# Patient Record
Sex: Male | Born: 2000 | Race: Black or African American | Hispanic: No | Marital: Single | Smoking: Never smoker
Health system: Southern US, Community
[De-identification: ages and names within clinical notes are randomized; demographics above are authoritative.]

## PROBLEM LIST (undated history)

## (undated) DIAGNOSIS — A539 Syphilis, unspecified: Secondary | ICD-10-CM

## (undated) DIAGNOSIS — W3400XA Accidental discharge from unspecified firearms or gun, initial encounter: Secondary | ICD-10-CM

## (undated) DIAGNOSIS — Y249XXA Unspecified firearm discharge, undetermined intent, initial encounter: Secondary | ICD-10-CM

## (undated) HISTORY — PX: ABDOMINAL SURGERY: SHX537

## (undated) HISTORY — PX: TONSILLECTOMY: SUR1361

## (undated) HISTORY — DX: Syphilis, unspecified: A53.9

---
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
## 2018-07-11 ENCOUNTER — Other Ambulatory Visit: Payer: Self-pay

## 2018-07-11 ENCOUNTER — Encounter (HOSPITAL_BASED_OUTPATIENT_CLINIC_OR_DEPARTMENT_OTHER): Payer: Self-pay | Admitting: *Deleted

## 2018-07-11 ENCOUNTER — Emergency Department (HOSPITAL_BASED_OUTPATIENT_CLINIC_OR_DEPARTMENT_OTHER)
Admission: EM | Admit: 2018-07-11 | Discharge: 2018-07-11 | Disposition: A | Discharge status: Home / Self Care | Payer: Medicaid Other | Attending: Emergency Medicine | Admitting: Emergency Medicine

## 2018-07-11 DIAGNOSIS — T65893A Toxic effect of other specified substances, assault, initial encounter: Secondary | ICD-10-CM | POA: Diagnosis not present

## 2018-07-11 DIAGNOSIS — R21 Rash and other nonspecific skin eruption: Secondary | ICD-10-CM | POA: Diagnosis present

## 2018-07-11 HISTORY — DX: Accidental discharge from unspecified firearms or gun, initial encounter: W34.00XA

## 2018-07-11 HISTORY — DX: Unspecified firearm discharge, undetermined intent, initial encounter: Y24.9XXA
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
## 2018-07-11 NOTE — Discharge Instructions (Signed)
Take 4 over the counter ibuprofen tablets 3 times a day or 2 over-the-counter naproxen tablets twice a day for pain. Also take tylenol 1000mg(2 extra strength) four times a day.    

## 2018-08-02 ENCOUNTER — Emergency Department (HOSPITAL_BASED_OUTPATIENT_CLINIC_OR_DEPARTMENT_OTHER)
Admission: EM | Admit: 2018-08-02 | Discharge: 2018-08-03 | Disposition: A | Discharge status: Home / Self Care | Payer: Medicaid Other | Attending: Emergency Medicine | Admitting: Emergency Medicine

## 2018-08-02 ENCOUNTER — Encounter (HOSPITAL_BASED_OUTPATIENT_CLINIC_OR_DEPARTMENT_OTHER): Payer: Self-pay | Admitting: *Deleted

## 2018-08-02 ENCOUNTER — Other Ambulatory Visit: Payer: Self-pay

## 2018-08-02 ENCOUNTER — Emergency Department (HOSPITAL_BASED_OUTPATIENT_CLINIC_OR_DEPARTMENT_OTHER): Payer: Medicaid Other

## 2018-08-02 DIAGNOSIS — Y9389 Activity, other specified: Secondary | ICD-10-CM | POA: Insufficient documentation

## 2018-08-02 DIAGNOSIS — S39012A Strain of muscle, fascia and tendon of lower back, initial encounter: Secondary | ICD-10-CM | POA: Diagnosis not present

## 2018-08-02 DIAGNOSIS — Z79899 Other long term (current) drug therapy: Secondary | ICD-10-CM | POA: Insufficient documentation

## 2018-08-02 DIAGNOSIS — Y999 Unspecified external cause status: Secondary | ICD-10-CM | POA: Diagnosis not present

## 2018-08-02 DIAGNOSIS — S3992XA Unspecified injury of lower back, initial encounter: Secondary | ICD-10-CM | POA: Diagnosis present

## 2018-08-02 DIAGNOSIS — Y92410 Unspecified street and highway as the place of occurrence of the external cause: Secondary | ICD-10-CM | POA: Insufficient documentation

## 2018-08-02 MED ORDER — IBUPROFEN 800 MG PO TABS
800.0000 mg | ORAL_TABLET | Freq: Once | ORAL | Status: AC
Start: 2018-08-02 — End: 2018-08-02
  Administered 2018-08-02: 800 mg via ORAL
  Filled 2018-08-02: qty 1

## 2018-08-02 NOTE — ED Notes (Signed)
Pt states "I was told I could leave and dont have to wait for results". Pt informed he has to wait to be discharged and wait in room. Informed pt his mother could come back to room. Pt cursing but remains in room.
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
## 2018-08-02 NOTE — ED Notes (Signed)
Pt no longer has arm band- asked pt where it was and he said he took it off and doesn't know.

## 2018-08-02 NOTE — ED Notes (Signed)
Patient transported to X-ray 

## 2018-08-02 NOTE — ED Notes (Signed)
Pt requesting updates, provided. Refused to put on gown as requested by this RN. Ambulatory out to the waiting room to socialize
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
## 2018-08-03 MED ORDER — IBUPROFEN 600 MG PO TABS: 600.0000 mg | ORAL_TABLET | Freq: Four times a day (QID) | ORAL | 0 refills | Status: DC | PRN

## 2018-08-03 NOTE — Discharge Instructions (Signed)
Your x-ray is negative.  Take the anti-inflammatories as needed for soreness over the next several days.  Follow-up with your primary doctor.  Return to the ED if you develop new or worsening symptoms including weakness, numbness, tingling or any other concerns.

## 2018-10-08 ENCOUNTER — Emergency Department (HOSPITAL_BASED_OUTPATIENT_CLINIC_OR_DEPARTMENT_OTHER)
Admission: EM | Admit: 2018-10-08 | Discharge: 2018-10-08 | Disposition: A | Discharge status: Home / Self Care | Payer: Medicaid Other | Attending: Emergency Medicine | Admitting: Emergency Medicine

## 2018-10-08 ENCOUNTER — Emergency Department (HOSPITAL_BASED_OUTPATIENT_CLINIC_OR_DEPARTMENT_OTHER): Payer: Medicaid Other

## 2018-10-08 ENCOUNTER — Encounter (HOSPITAL_BASED_OUTPATIENT_CLINIC_OR_DEPARTMENT_OTHER): Payer: Self-pay | Admitting: *Deleted

## 2018-10-08 ENCOUNTER — Other Ambulatory Visit: Payer: Self-pay

## 2018-10-08 DIAGNOSIS — R0789 Other chest pain: Secondary | ICD-10-CM | POA: Insufficient documentation

## 2018-10-08 DIAGNOSIS — Z20828 Contact with and (suspected) exposure to other viral communicable diseases: Secondary | ICD-10-CM | POA: Diagnosis not present

## 2018-10-08 DIAGNOSIS — R0602 Shortness of breath: Secondary | ICD-10-CM | POA: Insufficient documentation

## 2018-10-08 DIAGNOSIS — Z20822 Contact with and (suspected) exposure to covid-19: Secondary | ICD-10-CM

## 2018-10-08 DIAGNOSIS — R509 Fever, unspecified: Secondary | ICD-10-CM | POA: Diagnosis present

## 2018-10-08 DIAGNOSIS — B349 Viral infection, unspecified: Secondary | ICD-10-CM

## 2018-10-08 LAB — CBC WITH DIFFERENTIAL/PLATELET
Abs Immature Granulocytes: 0.04 10*3/uL (ref 0.00–0.07)
Basophils Absolute: 0 10*3/uL (ref 0.0–0.1)
Basophils Relative: 0 %
Eosinophils Absolute: 0 10*3/uL (ref 0.0–0.5)
Eosinophils Relative: 0 %
HCT: 48.1 % (ref 39.0–52.0)
Hemoglobin: 14.6 g/dL (ref 13.0–17.0)
Immature Granulocytes: 0 %
Lymphocytes Relative: 11 %
Lymphs Abs: 1.7 10*3/uL (ref 0.7–4.0)
MCH: 24.9 pg — ABNORMAL LOW (ref 26.0–34.0)
MCHC: 30.4 g/dL (ref 30.0–36.0)
MCV: 82.1 fL (ref 80.0–100.0)
Monocytes Absolute: 1.1 10*3/uL — ABNORMAL HIGH (ref 0.1–1.0)
Monocytes Relative: 7 %
Neutro Abs: 12 10*3/uL — ABNORMAL HIGH (ref 1.7–7.7)
Neutrophils Relative %: 82 %
Platelets: 279 10*3/uL (ref 150–400)
RBC: 5.86 MIL/uL — ABNORMAL HIGH (ref 4.22–5.81)
RDW: 14.7 % (ref 11.5–15.5)
WBC: 14.7 10*3/uL — ABNORMAL HIGH (ref 4.0–10.5)
nRBC: 0 % (ref 0.0–0.2)

## 2018-10-08 LAB — COMPREHENSIVE METABOLIC PANEL
ALT: 26 U/L (ref 0–44)
AST: 16 U/L (ref 15–41)
Albumin: 4.2 g/dL (ref 3.5–5.0)
Alkaline Phosphatase: 91 U/L (ref 38–126)
Anion gap: 9 (ref 5–15)
BUN: 7 mg/dL (ref 6–20)
CO2: 24 mmol/L (ref 22–32)
Calcium: 9.1 mg/dL (ref 8.9–10.3)
Chloride: 102 mmol/L (ref 98–111)
Creatinine, Ser: 0.74 mg/dL (ref 0.61–1.24)
GFR calc Af Amer: >60 mL/min (ref >60)
GFR calc non Af Amer: >60 mL/min (ref >60)
Glucose, Bld: 103 mg/dL — ABNORMAL HIGH (ref 70–99)
Potassium: 3.2 mmol/L — ABNORMAL LOW (ref 3.5–5.1)
Sodium: 135 mmol/L (ref 135–145)
Total Bilirubin: 1.3 mg/dL — ABNORMAL HIGH (ref 0.3–1.2)
Total Protein: 8.4 g/dL — ABNORMAL HIGH (ref 6.5–8.1)

## 2018-10-08 LAB — TROPONIN I: Troponin I: <0.03 ng/mL (ref <0.03)

## 2018-10-08 LAB — BRAIN NATRIURETIC PEPTIDE: B Natriuretic Peptide: 14.8 pg/mL (ref 0.0–100.0)

## 2018-10-08 LAB — SARS CORONAVIRUS 2 AG (30 MIN TAT): SARS Coronavirus 2 Ag: NEGATIVE

## 2018-10-08 LAB — D-DIMER, QUANTITATIVE: D-Dimer, Quant: 0.3 ug/mL-FEU (ref 0.00–0.50)

## 2018-10-08 LAB — LACTIC ACID, PLASMA: Lactic Acid, Venous: 1 mmol/L (ref 0.5–1.9)

## 2018-10-08 MED ORDER — ACETAMINOPHEN 325 MG PO TABS
650.0000 mg | ORAL_TABLET | Freq: Once | ORAL | Status: AC
Start: 2018-10-08 — End: 2018-10-08
  Administered 2018-10-08: 650 mg via ORAL

## 2018-10-08 MED ORDER — ACETAMINOPHEN 500 MG PO TABS: 1000.0000 mg | ORAL_TABLET | Freq: Four times a day (QID) | ORAL | 0 refills | Status: AC | PRN

## 2018-10-08 MED ORDER — ACETAMINOPHEN 325 MG PO TABS
ORAL_TABLET | ORAL | Status: AC
  Filled 2018-10-08: qty 2

## 2018-10-08 NOTE — ED Notes (Addendum)
Pt c/o generalized body aches, headache and fever that started today. C/o right knee pain for a week and central chest pain that started an hour ago.

## 2018-10-08 NOTE — ED Triage Notes (Addendum)
Pt c/o generalized pain  mid sternal cp x 1 day also c/o h/a and fever

## 2018-10-08 NOTE — Discharge Instructions (Signed)
1.  Due to your sudden onset of fever with headache and chest symptoms and body aches, you MAY have COVID-19.  Your test in the emergency department was negative, HOWEVER, the swab tests are not always right. 2.  You must self isolate and follow the instructions in your discharge instructions for coronavirus. 3.  Take Tylenol to control fevers and body aches. Motrin (ibuprofen) is not recommended 4.  Call your doctor tomorrow morning to set up a telemetry visit within the next 2 to 3 days. 5.  Return to the emergency department if you are getting worsening shortness of breath, confusion, dehydration or other concerning symptoms.CALL AHEAD TO LET us KNOW YOU MAY HAVE CORONA VIRUS SO PROPER PRECAUTIONS CAN  BE TAKEN.
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
## 2018-10-14 LAB — CULTURE, BLOOD (ROUTINE X 2): Culture: NO GROWTH

## 2019-03-10 ENCOUNTER — Emergency Department (HOSPITAL_BASED_OUTPATIENT_CLINIC_OR_DEPARTMENT_OTHER)
Admission: EM | Admit: 2019-03-10 | Discharge: 2019-03-10 | Disposition: A | Discharge status: Home / Self Care | Payer: Medicaid Other | Attending: Emergency Medicine | Admitting: Emergency Medicine

## 2019-03-10 ENCOUNTER — Other Ambulatory Visit: Payer: Self-pay

## 2019-03-10 ENCOUNTER — Encounter (HOSPITAL_BASED_OUTPATIENT_CLINIC_OR_DEPARTMENT_OTHER): Payer: Self-pay | Admitting: Emergency Medicine

## 2019-03-10 DIAGNOSIS — R4182 Altered mental status, unspecified: Secondary | ICD-10-CM | POA: Insufficient documentation

## 2019-03-10 DIAGNOSIS — R404 Transient alteration of awareness: Secondary | ICD-10-CM

## 2019-03-10 DIAGNOSIS — R55 Syncope and collapse: Secondary | ICD-10-CM | POA: Diagnosis present

## 2019-03-10 LAB — CBC WITH DIFFERENTIAL/PLATELET
Abs Immature Granulocytes: 0.05 10*3/uL (ref 0.00–0.07)
Basophils Absolute: 0 10*3/uL (ref 0.0–0.1)
Basophils Relative: 0 %
Eosinophils Absolute: 0.1 10*3/uL (ref 0.0–0.5)
Eosinophils Relative: 0 %
HCT: 47.2 % (ref 39.0–52.0)
Hemoglobin: 14.4 g/dL (ref 13.0–17.0)
Immature Granulocytes: 0 %
Lymphocytes Relative: 30 %
Lymphs Abs: 3.6 10*3/uL (ref 0.7–4.0)
MCH: 25 pg — ABNORMAL LOW (ref 26.0–34.0)
MCHC: 30.5 g/dL (ref 30.0–36.0)
MCV: 82.1 fL (ref 80.0–100.0)
Monocytes Absolute: 0.7 10*3/uL (ref 0.1–1.0)
Monocytes Relative: 5 %
Neutro Abs: 7.9 10*3/uL — ABNORMAL HIGH (ref 1.7–7.7)
Neutrophils Relative %: 65 %
Platelets: 313 10*3/uL (ref 150–400)
RBC: 5.75 MIL/uL (ref 4.22–5.81)
RDW: 14.1 % (ref 11.5–15.5)
WBC: 12.3 10*3/uL — ABNORMAL HIGH (ref 4.0–10.5)
nRBC: 0 % (ref 0.0–0.2)

## 2019-03-10 LAB — BASIC METABOLIC PANEL
Anion gap: 15 (ref 5–15)
BUN: 16 mg/dL (ref 6–20)
CO2: 22 mmol/L (ref 22–32)
Calcium: 9.1 mg/dL (ref 8.9–10.3)
Chloride: 100 mmol/L (ref 98–111)
Creatinine, Ser: 1.04 mg/dL (ref 0.61–1.24)
GFR calc Af Amer: >60 mL/min (ref >60)
GFR calc non Af Amer: >60 mL/min (ref >60)
Glucose, Bld: 154 mg/dL — ABNORMAL HIGH (ref 70–99)
Potassium: 3 mmol/L — ABNORMAL LOW (ref 3.5–5.1)
Sodium: 137 mmol/L (ref 135–145)

## 2019-03-10 MED ORDER — POTASSIUM CHLORIDE CRYS ER 20 MEQ PO TBCR
40.0000 meq | EXTENDED_RELEASE_TABLET | Freq: Once | ORAL | Status: AC
  Administered 2019-03-10: 40 meq via ORAL
  Filled 2019-03-10: qty 2

## 2019-03-10 NOTE — Discharge Instructions (Addendum)
   Do not drive until seen by your physician for your condition  Do not climb ladders/roofs/trees as a seizure can occur at that height and cause serious harm  Do not bathe/swim alone as a seizure can occur and cause serious harm  Please followup with your physician or neurologist for further testing and possible treatment  
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
## 2019-03-10 NOTE — ED Triage Notes (Signed)
Patient arrived via POV c/o near syncopal episode while sitting on couch. Patient states he became diaphoretic, "his feet were hot and he had trouble breathing and speaking but could move". Patient is AO x 4, VS WDL, staggering gait.

## 2019-12-20 ENCOUNTER — Encounter (HOSPITAL_BASED_OUTPATIENT_CLINIC_OR_DEPARTMENT_OTHER): Payer: Self-pay | Admitting: Emergency Medicine

## 2019-12-20 ENCOUNTER — Other Ambulatory Visit: Payer: Self-pay

## 2019-12-20 ENCOUNTER — Emergency Department (HOSPITAL_BASED_OUTPATIENT_CLINIC_OR_DEPARTMENT_OTHER)
Admission: EM | Admit: 2019-12-20 | Discharge: 2019-12-20 | Disposition: A | Discharge status: Home / Self Care | Payer: Medicaid Other | Attending: Emergency Medicine | Admitting: Emergency Medicine

## 2019-12-20 DIAGNOSIS — L739 Follicular disorder, unspecified: Secondary | ICD-10-CM | POA: Insufficient documentation

## 2019-12-20 DIAGNOSIS — L731 Pseudofolliculitis barbae: Secondary | ICD-10-CM

## 2019-12-20 DIAGNOSIS — R222 Localized swelling, mass and lump, trunk: Secondary | ICD-10-CM | POA: Diagnosis present

## 2019-12-20 NOTE — Discharge Instructions (Addendum)
You have been seen here for an ingrown hair.  Physical exam and vital signs are reassuring.  I recommend that you continue to place warm compresses on the area as this will help bring bacteria to the surface and the cyst will drain spontaneously.  You can take over-the-counter pain medications like ibuprofen or Tylenol as needed please follow dosing on the back of bottle.  I have given you information for [facility] they work with individuals with little to no insurance I recommend tat call him as I feel you will need a primary care doctor .    I want to come back to to the emergency department if you develop fever, chills, shortness of breath, chest pain, nausea, vomiting, diarrhea as the symptoms require further evaluation management.

## 2019-12-20 NOTE — ED Triage Notes (Signed)
Pt states that he has a bump to his upper penile region x 2 days
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
## 2020-03-13 ENCOUNTER — Encounter (HOSPITAL_BASED_OUTPATIENT_CLINIC_OR_DEPARTMENT_OTHER): Payer: Self-pay | Admitting: *Deleted

## 2020-03-13 ENCOUNTER — Other Ambulatory Visit: Payer: Self-pay

## 2020-03-13 ENCOUNTER — Emergency Department (HOSPITAL_BASED_OUTPATIENT_CLINIC_OR_DEPARTMENT_OTHER)
Admission: EM | Admit: 2020-03-13 | Discharge: 2020-03-13 | Disposition: A | Discharge status: Home / Self Care | Payer: Medicaid Other | Attending: Emergency Medicine | Admitting: Emergency Medicine

## 2020-03-13 DIAGNOSIS — L03211 Cellulitis of face: Secondary | ICD-10-CM | POA: Insufficient documentation

## 2020-03-13 DIAGNOSIS — H00014 Hordeolum externum left upper eyelid: Secondary | ICD-10-CM | POA: Diagnosis not present

## 2020-03-13 DIAGNOSIS — H02846 Edema of left eye, unspecified eyelid: Secondary | ICD-10-CM | POA: Diagnosis present

## 2020-03-13 MED ORDER — CEPHALEXIN 500 MG PO CAPS: 500.0000 mg | ORAL_CAPSULE | Freq: Two times a day (BID) | ORAL | 0 refills | Status: AC

## 2020-03-13 NOTE — ED Triage Notes (Signed)
Pt has sty left upper eyelid x 2 days
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
## 2020-03-13 NOTE — Discharge Instructions (Signed)
Take the antibiotics as directed. Continue warm compresses. Return to the ER for worsening pain, swelling, redness or drainage or trouble moving your eye.

## 2020-08-03 ENCOUNTER — Emergency Department (HOSPITAL_BASED_OUTPATIENT_CLINIC_OR_DEPARTMENT_OTHER): Payer: Medicaid Other

## 2020-08-03 ENCOUNTER — Emergency Department (HOSPITAL_BASED_OUTPATIENT_CLINIC_OR_DEPARTMENT_OTHER)
Admission: EM | Admit: 2020-08-03 | Discharge: 2020-08-03 | Disposition: A | Discharge status: Home / Self Care | Payer: Medicaid Other | Attending: Emergency Medicine | Admitting: Emergency Medicine

## 2020-08-03 ENCOUNTER — Encounter (HOSPITAL_BASED_OUTPATIENT_CLINIC_OR_DEPARTMENT_OTHER): Payer: Self-pay

## 2020-08-03 ENCOUNTER — Other Ambulatory Visit: Payer: Self-pay

## 2020-08-03 DIAGNOSIS — M25511 Pain in right shoulder: Secondary | ICD-10-CM | POA: Diagnosis not present

## 2020-08-03 DIAGNOSIS — M25551 Pain in right hip: Secondary | ICD-10-CM | POA: Insufficient documentation

## 2020-08-03 DIAGNOSIS — M542 Cervicalgia: Secondary | ICD-10-CM | POA: Insufficient documentation

## 2020-08-03 DIAGNOSIS — Y9389 Activity, other specified: Secondary | ICD-10-CM | POA: Insufficient documentation

## 2020-08-03 DIAGNOSIS — S0990XA Unspecified injury of head, initial encounter: Secondary | ICD-10-CM | POA: Diagnosis not present

## 2020-08-03 DIAGNOSIS — W208XXA Other cause of strike by thrown, projected or falling object, initial encounter: Secondary | ICD-10-CM | POA: Insufficient documentation

## 2020-08-03 DIAGNOSIS — T1490XA Injury, unspecified, initial encounter: Secondary | ICD-10-CM

## 2020-08-03 MED ORDER — IBUPROFEN 800 MG PO TABS
800.0000 mg | ORAL_TABLET | Freq: Once | ORAL | Status: AC
  Administered 2020-08-03: 800 mg via ORAL
  Filled 2020-08-03: qty 1

## 2020-08-03 MED ORDER — OXYCODONE-ACETAMINOPHEN 5-325 MG PO TABS
1.0000 | ORAL_TABLET | Freq: Once | ORAL | Status: DC
  Filled 2020-08-03: qty 1

## 2020-08-03 MED ORDER — IBUPROFEN 800 MG PO TABS: 800.0000 mg | ORAL_TABLET | Freq: Once | ORAL | Status: DC

## 2020-08-03 NOTE — ED Triage Notes (Signed)
Pt c/o pain to right side of neck and head- "to the whole right side of my body" after his "ceiling fell in on me" ~15 min PTA-NAD-steady gait
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
## 2021-12-11 IMAGING — CT CT HEAD W/O CM
4 series · 15 of 47 positions shown, 17 images · non-contrast
Comparison: None.

CLINICAL DATA: Minor head trauma.

EXAM:
CT HEAD WITHOUT CONTRAST
TECHNIQUE: Contiguous axial images were obtained from the base of the skull
through the vertex without intravenous contrast.

[Series 2: head wo · axial · 0.44mm/px · z∈[-188,-68]mm · 7 of 33 slices shown, 9 images]
[im 5/33  brain]
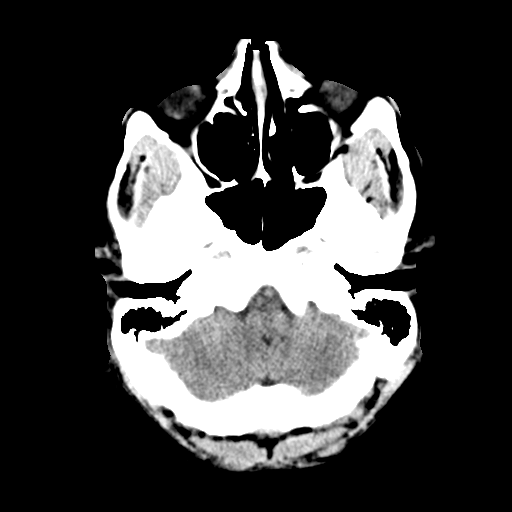
[im 5/33  bone]
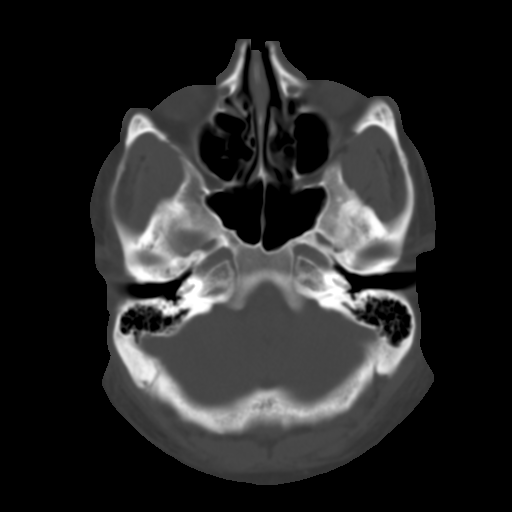
[im 9/33  brain]
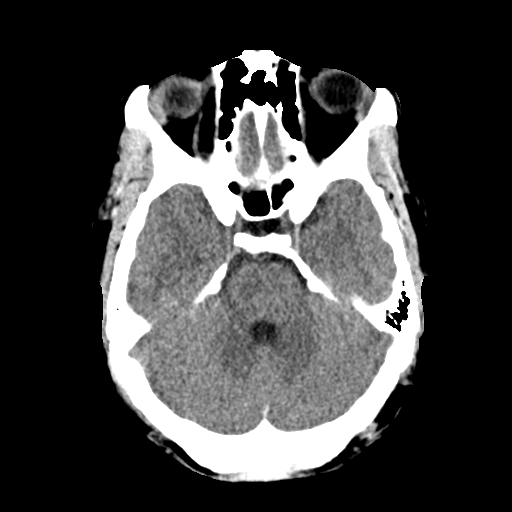
[im 13/33  brain]
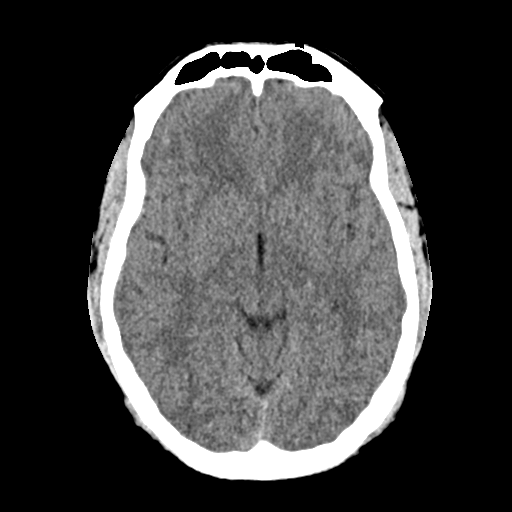
[im 17/33  brain]
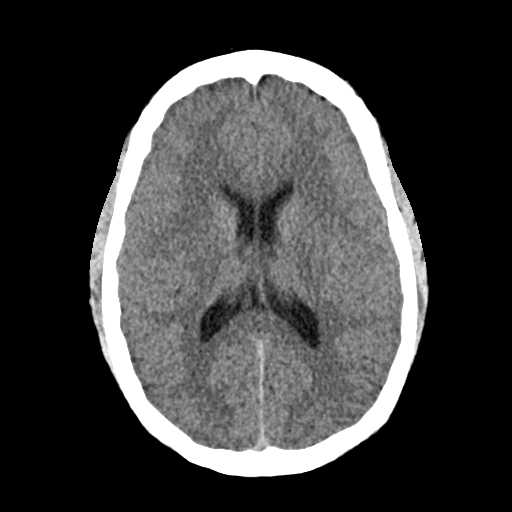
[im 21/33  brain]
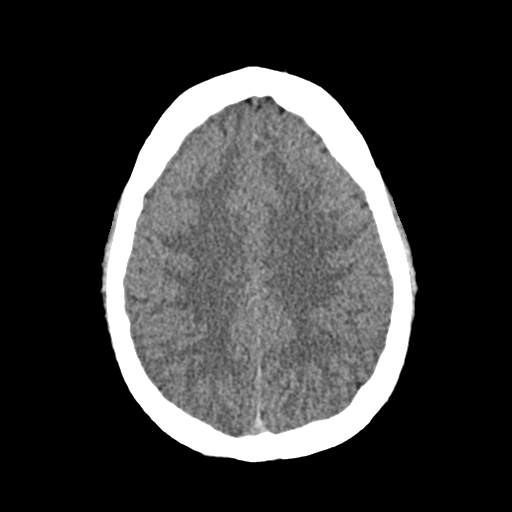
[im 21/33  bone]
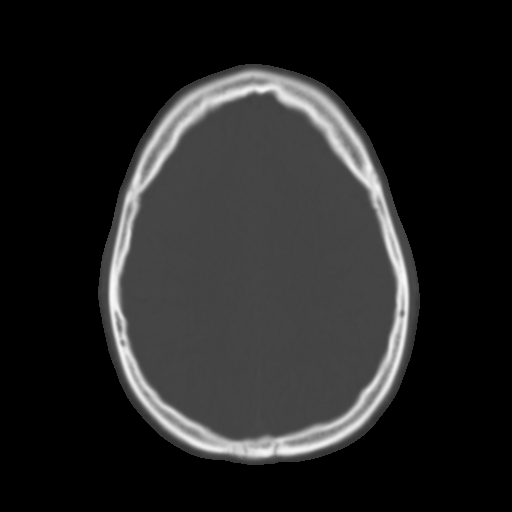
[im 25/33  brain]
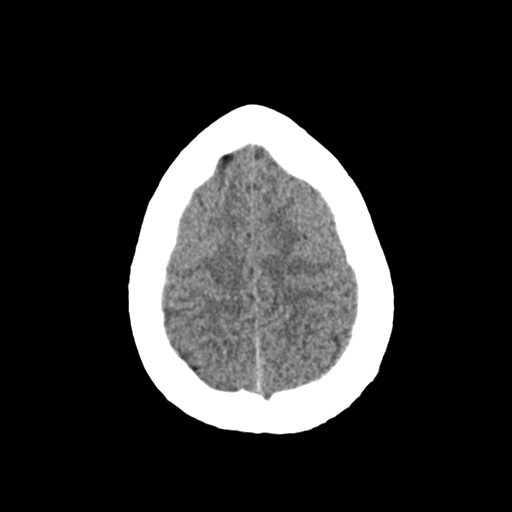
[im 29/33  brain]
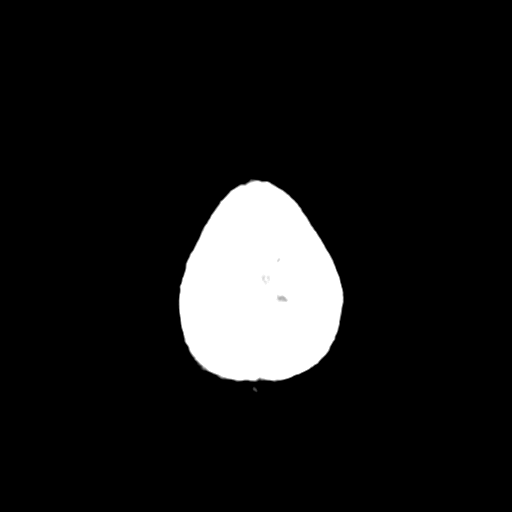

[Series 3: head bone · axial · 0.44mm/px · z∈[-192,-176]mm · 2 of 83 slices shown]
[im 9/83  bone]
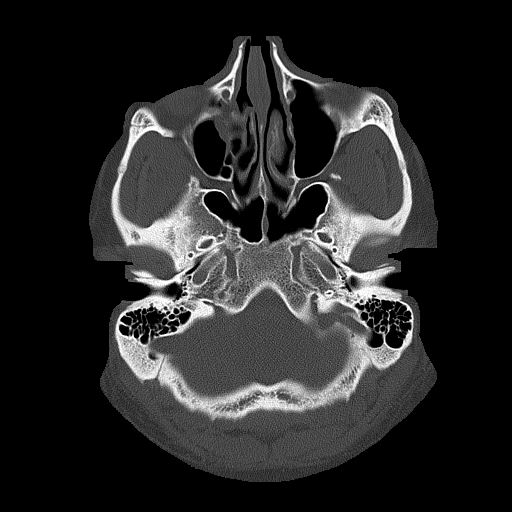
[im 17/83  bone]
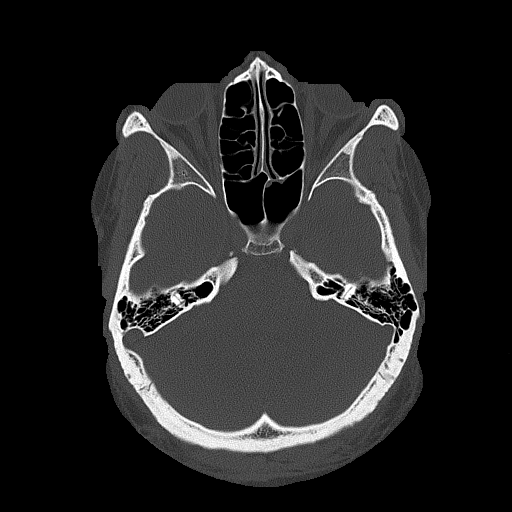

[Series 4: coronal soft · coronal · 0.33mm/px · 3 of 74 slices shown]
[im 25/74  brain]
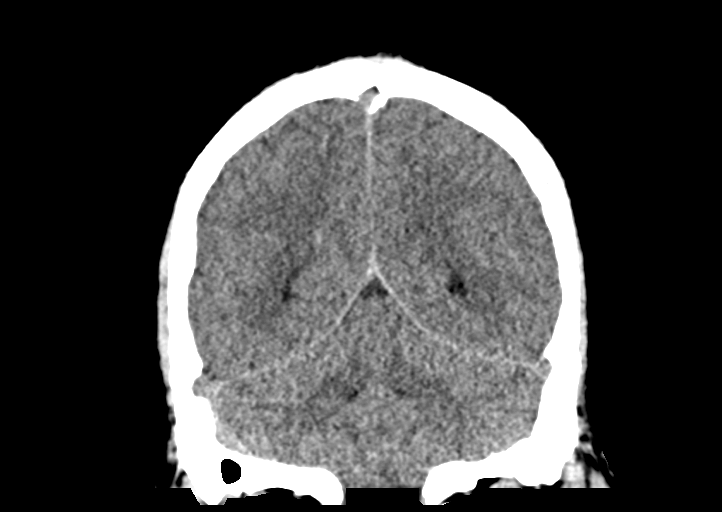
[im 33/74  brain]
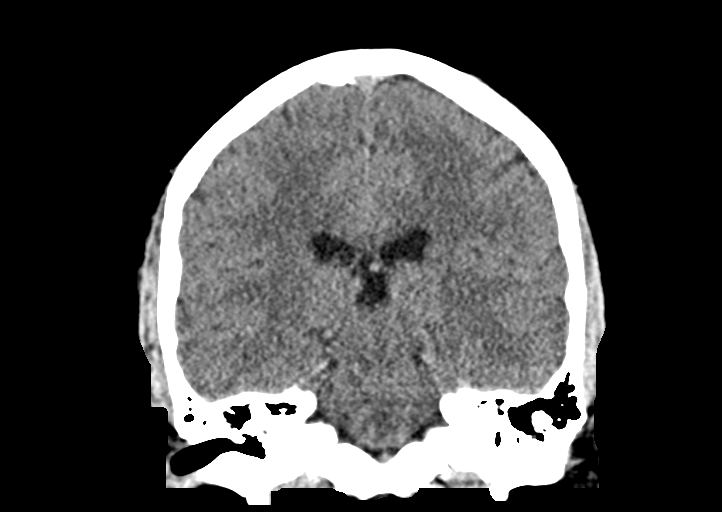
[im 41/74  brain]
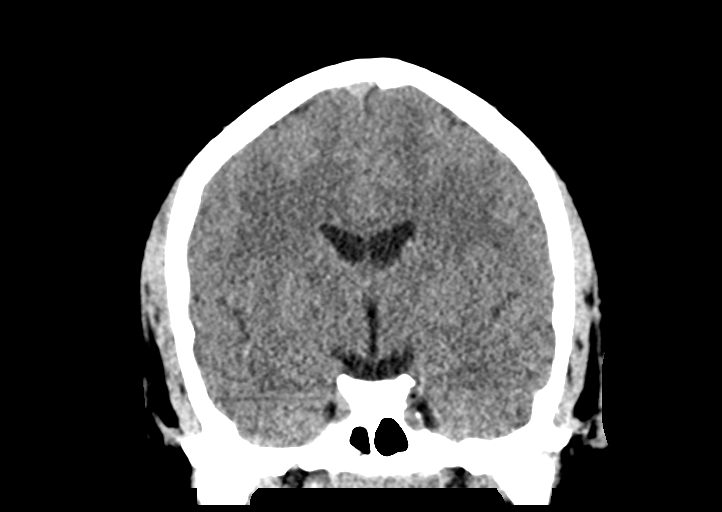

[Series 5: sag soft · sagittal · 0.32mm/px · 3 of 67 slices shown]
[im 23/67  brain]
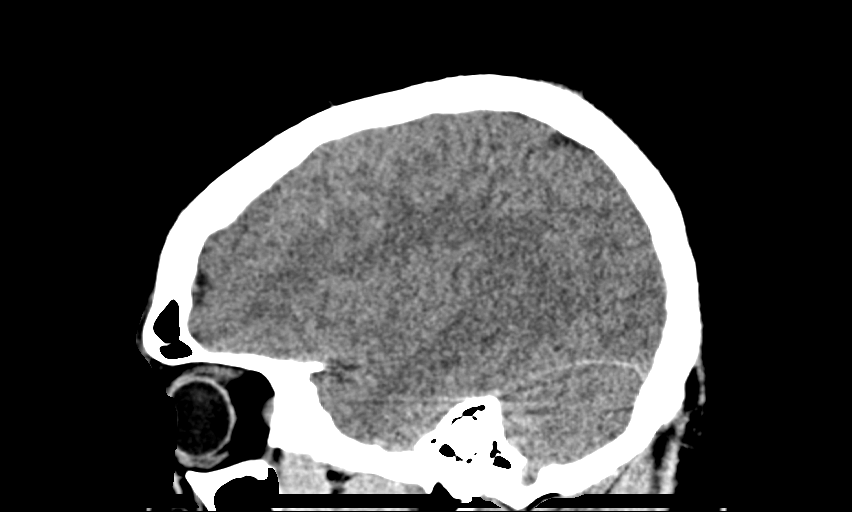
[im 34/67  brain]
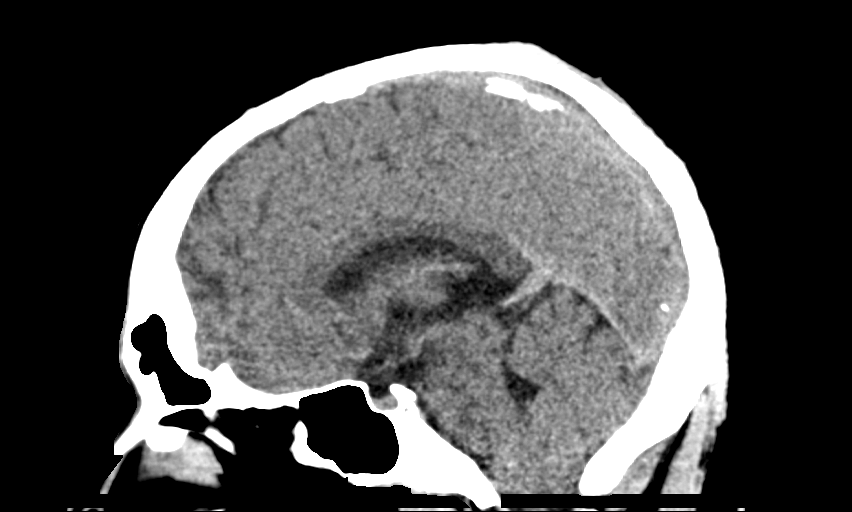
[im 45/67  brain]
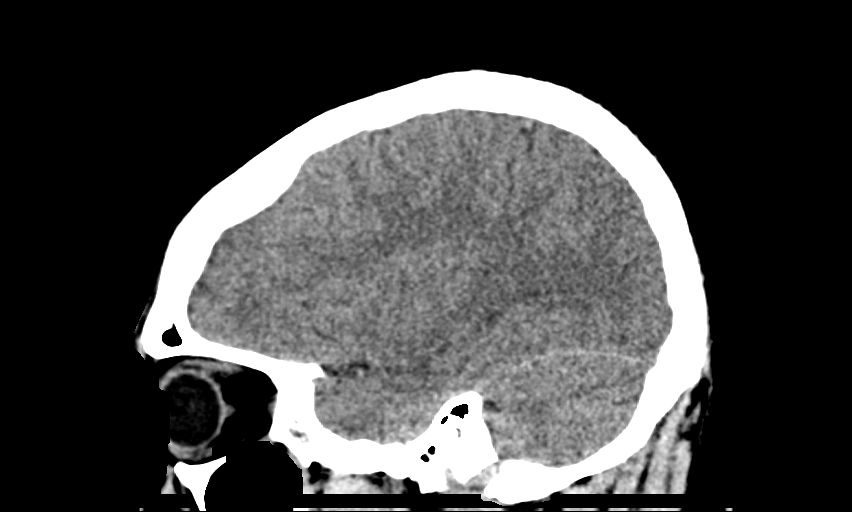

[15 of 47 positions shown; findings below may reference images not displayed]

FINDINGS: Brain: No evidence of acute infarction, hemorrhage, hydrocephalus,
extra-axial collection or mass lesion/mass effect.

Vascular: No hyperdense vessel or unexpected calcification.

Skull: Normal. Negative for fracture or focal lesion.

Sinuses/Orbits: No acute finding.

Other: None.
IMPRESSION: No acute intracranial abnormality noted.

## 2022-12-23 ENCOUNTER — Other Ambulatory Visit: Payer: Self-pay

## 2022-12-23 ENCOUNTER — Encounter (HOSPITAL_BASED_OUTPATIENT_CLINIC_OR_DEPARTMENT_OTHER): Payer: Self-pay

## 2022-12-23 ENCOUNTER — Emergency Department (HOSPITAL_BASED_OUTPATIENT_CLINIC_OR_DEPARTMENT_OTHER): Payer: Medicaid Other

## 2022-12-23 ENCOUNTER — Emergency Department (HOSPITAL_BASED_OUTPATIENT_CLINIC_OR_DEPARTMENT_OTHER)
Admission: EM | Admit: 2022-12-23 | Discharge: 2022-12-23 | Disposition: A | Discharge status: Home / Self Care | Payer: Medicaid Other | Attending: Emergency Medicine | Admitting: Emergency Medicine

## 2022-12-23 DIAGNOSIS — S50812A Abrasion of left forearm, initial encounter: Secondary | ICD-10-CM | POA: Diagnosis not present

## 2022-12-23 DIAGNOSIS — E669 Obesity, unspecified: Secondary | ICD-10-CM | POA: Diagnosis not present

## 2022-12-23 DIAGNOSIS — R0789 Other chest pain: Secondary | ICD-10-CM | POA: Diagnosis not present

## 2022-12-23 DIAGNOSIS — S62667A Nondisplaced fracture of distal phalanx of left little finger, initial encounter for closed fracture: Secondary | ICD-10-CM | POA: Insufficient documentation

## 2022-12-23 DIAGNOSIS — Z6841 Body Mass Index (BMI) 40.0 and over, adult: Secondary | ICD-10-CM | POA: Insufficient documentation

## 2022-12-23 DIAGNOSIS — S022XXA Fracture of nasal bones, initial encounter for closed fracture: Secondary | ICD-10-CM | POA: Insufficient documentation

## 2022-12-23 DIAGNOSIS — Y900 Blood alcohol level of less than 20 mg/100 ml: Secondary | ICD-10-CM | POA: Insufficient documentation

## 2022-12-23 DIAGNOSIS — Y92410 Unspecified street and highway as the place of occurrence of the external cause: Secondary | ICD-10-CM | POA: Insufficient documentation

## 2022-12-23 DIAGNOSIS — Z23 Encounter for immunization: Secondary | ICD-10-CM | POA: Insufficient documentation

## 2022-12-23 DIAGNOSIS — S0990XA Unspecified injury of head, initial encounter: Secondary | ICD-10-CM | POA: Diagnosis present

## 2022-12-23 DIAGNOSIS — T07XXXA Unspecified multiple injuries, initial encounter: Secondary | ICD-10-CM

## 2022-12-23 LAB — I-STAT CHEM 8, ED
BUN: 10 mg/dL (ref 6–20)
Calcium, Ion: 1.2 mmol/L (ref 1.15–1.40)
Chloride: 104 mmol/L (ref 98–111)
Creatinine, Ser: 1 mg/dL (ref 0.61–1.24)
Glucose, Bld: 102 mg/dL — ABNORMAL HIGH (ref 70–99)
HCT: 48 % (ref 39.0–52.0)
Hemoglobin: 16.3 g/dL (ref 13.0–17.0)
Potassium: 3.7 mmol/L (ref 3.5–5.1)
Sodium: 141 mmol/L (ref 135–145)
TCO2: 27 mmol/L (ref 22–32)

## 2022-12-23 LAB — CBC
HCT: 45.6 % (ref 39.0–52.0)
Hemoglobin: 14.4 g/dL (ref 13.0–17.0)
MCH: 25.5 pg — ABNORMAL LOW (ref 26.0–34.0)
MCHC: 31.6 g/dL (ref 30.0–36.0)
MCV: 80.9 fL (ref 80.0–100.0)
Platelets: 328 10*3/uL (ref 150–400)
RBC: 5.64 MIL/uL (ref 4.22–5.81)
RDW: 14.8 % (ref 11.5–15.5)
WBC: 15.2 10*3/uL — ABNORMAL HIGH (ref 4.0–10.5)
nRBC: 0 % (ref 0.0–0.2)

## 2022-12-23 LAB — COMPREHENSIVE METABOLIC PANEL
ALT: 57 U/L — ABNORMAL HIGH (ref 0–44)
AST: 44 U/L — ABNORMAL HIGH (ref 15–41)
Albumin: 4.6 g/dL (ref 3.5–5.0)
Alkaline Phosphatase: 85 U/L (ref 38–126)
Anion gap: 10 (ref 5–15)
BUN: 11 mg/dL (ref 6–20)
CO2: 27 mmol/L (ref 22–32)
Calcium: 9.9 mg/dL (ref 8.9–10.3)
Chloride: 103 mmol/L (ref 98–111)
Creatinine, Ser: 0.86 mg/dL (ref 0.61–1.24)
GFR, Estimated: >60 mL/min (ref >60)
Glucose, Bld: 100 mg/dL — ABNORMAL HIGH (ref 70–99)
Potassium: 3.5 mmol/L (ref 3.5–5.1)
Sodium: 140 mmol/L (ref 135–145)
Total Bilirubin: 0.5 mg/dL (ref 0.3–1.2)
Total Protein: 8.6 g/dL — ABNORMAL HIGH (ref 6.5–8.1)

## 2022-12-23 LAB — LACTIC ACID, PLASMA: Lactic Acid, Venous: 1.5 mmol/L (ref 0.5–1.9)

## 2022-12-23 LAB — PROTIME-INR
INR: 1 (ref 0.8–1.2)
Prothrombin Time: 13.1 seconds (ref 11.4–15.2)

## 2022-12-23 LAB — ETHANOL: Alcohol, Ethyl (B): 15 mg/dL — ABNORMAL HIGH (ref <10)

## 2022-12-23 MED ORDER — CEPHALEXIN 500 MG PO CAPS: 500.0000 mg | ORAL_CAPSULE | Freq: Two times a day (BID) | ORAL | 0 refills | Status: AC

## 2022-12-23 MED ORDER — TETANUS-DIPHTH-ACELL PERTUSSIS 5-2.5-18.5 LF-MCG/0.5 IM SUSY
0.5000 mL | PREFILLED_SYRINGE | Freq: Once | INTRAMUSCULAR | Status: AC
  Administered 2022-12-23: 0.5 mL via INTRAMUSCULAR
  Filled 2022-12-23: qty 0.5

## 2022-12-23 MED ORDER — HYDROCODONE-ACETAMINOPHEN 5-325 MG PO TABS
1.0000 | ORAL_TABLET | Freq: Once | ORAL | Status: AC
  Administered 2022-12-23: 1 via ORAL
  Filled 2022-12-23: qty 1

## 2022-12-23 MED ORDER — IBUPROFEN 600 MG PO TABS: 600.0000 mg | ORAL_TABLET | Freq: Three times a day (TID) | ORAL | 0 refills | Status: AC | PRN

## 2022-12-23 MED ORDER — HYDROCODONE-ACETAMINOPHEN 5-325 MG PO TABS: 1.0000 | ORAL_TABLET | Freq: Four times a day (QID) | ORAL | 0 refills | Status: AC | PRN

## 2022-12-23 MED ORDER — FENTANYL CITRATE PF 50 MCG/ML IJ SOSY
50.0000 ug | PREFILLED_SYRINGE | Freq: Once | INTRAMUSCULAR | Status: AC
  Administered 2022-12-23: 50 ug via INTRAVENOUS
  Filled 2022-12-23: qty 1

## 2022-12-23 MED ORDER — IBUPROFEN 400 MG PO TABS
600.0000 mg | ORAL_TABLET | Freq: Once | ORAL | Status: AC
  Administered 2022-12-23: 600 mg via ORAL
  Filled 2022-12-23: qty 1

## 2022-12-23 MED ORDER — IOHEXOL 300 MG/ML  SOLN
125.0000 mL | Freq: Once | INTRAMUSCULAR | Status: AC | PRN
  Administered 2022-12-23: 125 mL via INTRAVENOUS

## 2022-12-23 NOTE — ED Notes (Signed)
Dressing applied to pt left hand fourth and fifth digit. Cleaned wound with sterile water. Non-adherent dressing applied along with triple antibiotic ointment.   Splint applied to left pinky finger.   Crutches also requested by patient.
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
## 2022-12-23 NOTE — ED Notes (Signed)
C-Collar applied to pt when he was placed in room
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
## 2022-12-23 NOTE — ED Notes (Signed)
Pt requested to call sister. Spoke with her and she is on the way back to the ER to be with pt.

## 2022-12-23 NOTE — ED Notes (Signed)
C collar placed at this time.

## 2023-12-03 ENCOUNTER — Encounter (HOSPITAL_BASED_OUTPATIENT_CLINIC_OR_DEPARTMENT_OTHER): Payer: Self-pay

## 2023-12-03 ENCOUNTER — Other Ambulatory Visit: Payer: Self-pay

## 2023-12-03 ENCOUNTER — Emergency Department (HOSPITAL_BASED_OUTPATIENT_CLINIC_OR_DEPARTMENT_OTHER)
Admission: EM | Admit: 2023-12-03 | Discharge: 2023-12-03 | Disposition: A | Discharge status: Home / Self Care | Attending: Emergency Medicine | Admitting: Emergency Medicine

## 2023-12-03 DIAGNOSIS — L814 Other melanin hyperpigmentation: Secondary | ICD-10-CM | POA: Insufficient documentation

## 2023-12-03 DIAGNOSIS — I10 Essential (primary) hypertension: Secondary | ICD-10-CM | POA: Diagnosis not present

## 2023-12-03 DIAGNOSIS — L819 Disorder of pigmentation, unspecified: Secondary | ICD-10-CM

## 2023-12-03 DIAGNOSIS — R21 Rash and other nonspecific skin eruption: Secondary | ICD-10-CM | POA: Diagnosis present

## 2023-12-03 DIAGNOSIS — Z79899 Other long term (current) drug therapy: Secondary | ICD-10-CM | POA: Insufficient documentation

## 2023-12-03 NOTE — ED Provider Notes (Signed)
 [place] [facility] Provider Note   CSN: [id number] Arrival date & time: 12/03/23  1201     Patient presents with: Rash   Timothy Frazier is a 23 y.o. male with overall laboratory past medical history presents concern for vague hyperpigmentation rash noted on right forearm.  This started yesterday.  No itching, pain. No previous history of similar.,  Denies any recent bug bites, exposure to plants.  Denies any fever, chills.  Has not tried anything.    Rash      Prior to Admission medications   Medication Sig Start Date End Date Taking? Authorizing Provider  acetaminophen  (TYLENOL ) 500 MG tablet Take 2 tablets (1,000 mg total) by mouth every 6 (six) hours as needed. 10/08/18   Armenta Canning, MD  HYDROcodone -acetaminophen  (NORCO/VICODIN) 5-325 MG tablet Take 1 tablet by mouth every 6 (six) hours as needed for severe pain. 12/23/22   Roselyn Carlin NOVAK, MD  hydrOXYzine  (ATARAX /VISTARIL ) 25 MG tablet Take 1 tablet (25 mg total) by mouth 3 (three) times daily as needed for itching. 11/27/15   Gretel Rosina CROME, PA-C  ibuprofen  (ADVIL ) 600 MG tablet Take 1 tablet (600 mg total) by mouth every 8 (eight) hours as needed for moderate pain. 12/23/22   Roselyn Carlin NOVAK, MD  triamcinolone  cream (KENALOG ) 0.1 % Apply 1 application topically 2 (two) times daily. To affected bites. 11/27/15   Gretel Rosina CROME, PA-C    Allergies: Patient has no known allergies.    Review of Systems  Skin:  Positive for rash.  All other systems reviewed and are negative.   Updated Vital Signs BP (!) 145/80   Pulse 71   Temp 98.3 F (36.8 C) (Oral)   Resp 12   Ht 5' 5 (1.651 m)   Wt 136.1 kg   SpO2 97%   BMI 49.92 kg/m   Physical Exam Vitals and nursing note reviewed.  Constitutional:      General: He is not in acute distress.    Appearance: Normal appearance.  HENT:     Head: Normocephalic and atraumatic.  Eyes:     General:        Right eye: No discharge.         Left eye: No discharge.  Cardiovascular:     Rate and Rhythm: Normal rate and regular rhythm.  Pulmonary:     Effort: Pulmonary effort is normal. No respiratory distress.  Musculoskeletal:        General: No deformity.  Skin:    General: Skin is warm and dry.     Comments: Very subtle, almost imperceptible slight hyperpigmentation change in irregular lattice like pattern on right forearm.  No raised bumps, no erythema, no itching.  No skin sloughing, no target lesions.  Rashes limited to this area per patient.  Neurological:     Mental Status: He is alert and oriented to person, place, and time.  Psychiatric:        Mood and Affect: Mood normal.        Behavior: Behavior normal.     (all labs ordered are listed, but only abnormal results are displayed) Labs Reviewed - No data to display  EKG: None  Radiology: No results found.   Procedures   Medications Ordered in the ED - No data to display  Medical Decision Making  This an overall well-appearing 23 year old male who presents concern for nonspecific hyperpigmentation rash with no other associated symptoms.  Patient is well-appearing, stable vital signs other than mild hypertension, blood pressure 145/80.  Discussed with patient that on exam I do not see any clinical signs or symptoms of dermatitis, shingles, allergic reaction, other infectious or inflammatory rash, given his darker skin tone discussed that a mild hyperpigmentation reaction could be a response to many different stimuli, overall it is hard to perceive the rash she is talking about, suspects normal variations in skin tone with slight more pronounced appearance of hyperpigmentation.  Recommended watchful waiting, dermatology follow-up as needed, discussed return precautions for significant worsening pain, swelling, redness, itching.  Final diagnoses:  Hyperpigmentation    ED Discharge Orders     None          Rosan Sherlean DEL, [place] 12/03/23 1248    Tegeler, Lonni PARAS, MD 12/03/23 [phone]

## 2023-12-03 NOTE — Discharge Instructions (Signed)
 I did not see any evidence of an allergic reaction, infectious rash, contact dermatitis, eczema, or other worrisome condition.  As we discussed when you have darker skin at baseline it is easy to get some mild hyperpigmentation or darkening of the skin from any kind of scratching, itching, or other inflammatory response.  It is difficult to say what could be causing the slight color change, but I do not see anything that looks emergent or worrisome.  You can follow-up with a dermatologist if you continue to have concerns, if you have significant worsening itching, pain, or change in the context of the rash please return for further evaluation.

## 2023-12-03 NOTE — ED Triage Notes (Signed)
 Pt reports small spots on arms. Writer can see faint dark spots on arms. Not raised. Pt denies itching/pain. Denies any associated symptoms. Pt not ill appearing.

## 2024-06-19 ENCOUNTER — Emergency Department (HOSPITAL_BASED_OUTPATIENT_CLINIC_OR_DEPARTMENT_OTHER)
Admission: EM | Admit: 2024-06-19 | Discharge: 2024-06-19 | Disposition: A | Discharge status: Home / Self Care | Attending: Emergency Medicine | Admitting: Emergency Medicine

## 2024-06-19 ENCOUNTER — Other Ambulatory Visit: Payer: Self-pay

## 2024-06-19 ENCOUNTER — Encounter (HOSPITAL_BASED_OUTPATIENT_CLINIC_OR_DEPARTMENT_OTHER): Payer: Self-pay | Admitting: Emergency Medicine

## 2024-06-19 DIAGNOSIS — A64 Unspecified sexually transmitted disease: Secondary | ICD-10-CM | POA: Diagnosis present

## 2024-06-19 DIAGNOSIS — A539 Syphilis, unspecified: Secondary | ICD-10-CM | POA: Insufficient documentation

## 2024-06-19 MED ORDER — DOXYCYCLINE HYCLATE 100 MG PO CAPS: 100.0000 mg | ORAL_CAPSULE | Freq: Two times a day (BID) | ORAL | 0 refills | Status: AC

## 2024-06-19 NOTE — Discharge Instructions (Addendum)
 I have sent in a prescription for doxycycline  for you to take twice a day for the next 2 weeks.  Remember, you will need to avoid sexual contact as you can infect others and infect yourself.  Please make sure you follow with your primary care doctor after completion of these antibiotics as you will need to have your numbers retested.  It is very important you stay compliant with this medication to help with your diagnosis.  I have attached more information on this into your discharge report for you to review.  If you have any concerns, new or worsening symptoms, please return to your nearest emergency department for reevaluation.  Contact a health care provider if: You continue to have any of the following symptoms 24 hours after beginning treatment: Fever. Chills. Headache. Nausea. Aching all over your body. Your symptoms do not improve, even with treatment. Get help right away if: You have severe chest pain. You have trouble walking or coordinating movements. You are confused. You lose vision or hearing. You have numbness in your arms or legs. You have a seizure. You faint. You have a severe headache that does not go away with medicine. These symptoms may be an emergency. Get help right away. Call 911. Do not wait to see if the symptoms will go away. Do not drive yourself to the hospital.

## 2024-06-19 NOTE — ED Provider Notes (Signed)
 " [place] [facility] Provider Note   CSN: [id number] Arrival date & time: 06/19/24  1054     Patient presents with: SEXUALLY TRANSMITTED DISEASE   Timothy Frazier is a 24 y.o. male with history of hypertension presents emerged from today for evaluation of medication availability.  Patient was recently diagnosed with syphilis at his PCP office.  Was given prescription for Bicillin and to go to [facility] however they told they cannot fill the medication.  Unfortunately, there is a scientist, clinical (histocompatibility and immunogenetics) on Bicillin.  Patient does not have any symptoms.  Syphilis was found on routine STD test.  Presented here for medication management.  HPI     Prior to Admission medications  Medication Sig Start Date End Date Taking? Authorizing Provider  acetaminophen  (TYLENOL ) 500 MG tablet Take 2 tablets (1,000 mg total) by mouth every 6 (six) hours as needed. 10/08/18   Armenta Canning, MD  HYDROcodone -acetaminophen  (NORCO/VICODIN) 5-325 MG tablet Take 1 tablet by mouth every 6 (six) hours as needed for severe pain. 12/23/22   Roselyn Carlin NOVAK, MD  hydrOXYzine  (ATARAX /VISTARIL ) 25 MG tablet Take 1 tablet (25 mg total) by mouth 3 (three) times daily as needed for itching. 11/27/15   Gretel Rosina CROME, PA-C  ibuprofen  (ADVIL ) 600 MG tablet Take 1 tablet (600 mg total) by mouth every 8 (eight) hours as needed for moderate pain. 12/23/22   Roselyn Carlin NOVAK, MD  triamcinolone  cream (KENALOG ) 0.1 % Apply 1 application topically 2 (two) times daily. To affected bites. 11/27/15   Gretel Rosina CROME, PA-C    Allergies: Patient has no known allergies.    Review of Systems  Constitutional:  Negative for chills and fever.  Genitourinary:  Negative for dysuria, penile discharge, penile pain, penile swelling, scrotal swelling and testicular pain.  Skin:  Negative for rash.    Updated Vital Signs BP (!) 155/98 (BP Location: Right Arm)   Pulse 78   Temp 98.1 F (36.7 C) (Oral)   Resp  15   Ht 5' 6 (1.676 m)   SpO2 99%   BMI 48.42 kg/m   Physical Exam Vitals and nursing note reviewed.  Constitutional:      General: He is not in acute distress.    Appearance: He is not ill-appearing or toxic-appearing.  HENT:     Mouth/Throat:     Mouth: Mucous membranes are moist.  Eyes:     General: No scleral icterus. Pulmonary:     Effort: Pulmonary effort is normal. No respiratory distress.  Skin:    General: Skin is warm and dry.  Neurological:     Mental Status: He is alert.     (all labs ordered are listed, but only abnormal results are displayed) Labs Reviewed - No data to display  EKG: None  Radiology: No results found.  Procedures   Medications Ordered in the ED - No data to display  Medical Decision Making Risk Prescription drug management.   24 y.o. male presents to the ER today for evaluation of medication management. Differential diagnosis includes but is not limited to medication availability versus alternative treatments. Vital signs mild elevated blood pressure otherwise unremarkable. Physical exam as noted above.   Patient presented for syphilis treatment.  There is a scientist, clinical (histocompatibility and immunogenetics) of Bicillin.  Due to this, patient presented for alternative medication recommendations.  We do not have any Bicillin even in this department.  Will prescribe him doxycycline  to take twice a day for the next 14 days.  Discussed this with pharmacist, Vernell.  Discussed the Bicillin shortage and need for alternative medications with patient at bedside.  He is agreeable to this plan.  Recommended setting an alarm on his phone for reminders to take his medication for compliance, recommended avoiding sexual interaction/contact to avoid spread, recommended follow-up with health [place] or's PCP for retest.  Stable for discharge.  We discussed plan at bedside. We discussed strict return precautions and red flag symptoms. The patient verbalized their understanding and  agrees to the plan. The patient is stable and being discharged home in good condition.  Portions of this report may have been transcribed using voice recognition software. Every effort was made to ensure accuracy; however, inadvertent computerized transcription errors may be present.    Final diagnoses:  Syphilis    ED Discharge Orders     None          Bernis Ernst, [place] 06/19/24 1118    Fredia Rosette Kirsch, MD 06/19/24 1558  "

## 2024-06-19 NOTE — ED Notes (Signed)
 Discharge paperwork reviewed entirely with patient, including follow up care. Pain was under control. The patient received instruction and coaching on their prescriptions, and all follow-up questions were answered.  Pt verbalized understanding as well as all parties involved. No questions or concerns voiced at the time of discharge. No acute distress noted. Pt was encouraged to stay adequately hydrated and eat a healthy diet.   Pt ambulated out to PVA without incident or assistance.  Pt was given information to obtain and notify a PCP.   The pt was instructed to set up and/or review MyChart for their results; and was informed their Providers all have access to the information as well.

## 2024-06-19 NOTE — ED Triage Notes (Signed)
 Dx with STD  and [facility] will not fill rx
